# Patient Record
Sex: Female | Born: 1960 | Race: White | Hispanic: No | Marital: Married | Smoking: Never smoker
Health system: Southern US, Community
[De-identification: ages and names within clinical notes are randomized; demographics above are authoritative.]

## PROBLEM LIST (undated history)

## (undated) HISTORY — PX: BUNIONECTOMY: SHX129

## (undated) HISTORY — PX: KNEE SURGERY: SHX244

## (undated) HISTORY — PX: TONSILLECTOMY: SUR1361

---

## 2005-04-25 ENCOUNTER — Ambulatory Visit: Payer: Self-pay | Admitting: Family Medicine

## 2005-05-05 ENCOUNTER — Ambulatory Visit: Payer: Self-pay | Admitting: Family Medicine

## 2006-02-28 ENCOUNTER — Ambulatory Visit: Payer: Self-pay | Admitting: Family Medicine

## 2006-03-01 ENCOUNTER — Ambulatory Visit: Payer: Self-pay | Admitting: Family Medicine

## 2006-06-24 ENCOUNTER — Ambulatory Visit: Payer: Self-pay | Admitting: Family Medicine

## 2007-07-11 ENCOUNTER — Ambulatory Visit: Payer: Self-pay | Admitting: Family Medicine

## 2007-07-14 ENCOUNTER — Ambulatory Visit: Payer: Self-pay | Admitting: Family Medicine

## 2010-02-20 ENCOUNTER — Ambulatory Visit: Payer: Self-pay | Admitting: *Deleted

## 2011-05-26 ENCOUNTER — Ambulatory Visit: Payer: Self-pay | Admitting: *Deleted

## 2011-06-03 DIAGNOSIS — M9420 Chondromalacia, unspecified site: Secondary | ICD-10-CM | POA: Insufficient documentation

## 2011-09-24 DIAGNOSIS — M21619 Bunion of unspecified foot: Secondary | ICD-10-CM | POA: Insufficient documentation

## 2012-03-24 ENCOUNTER — Ambulatory Visit: Payer: Self-pay | Admitting: Otolaryngology

## 2012-10-11 IMAGING — US ULTRASOUND LEFT BREAST
1 series · 14 of 14 positions shown · non-contrast
Comparison: none

REASON FOR EXAM: LT BRST MASS 12 OCLOCK 3 OCLOCK
COMMENTS:

[Series 1: ultrasound left breast · 14 of 14 slices shown]
[im 1/14]
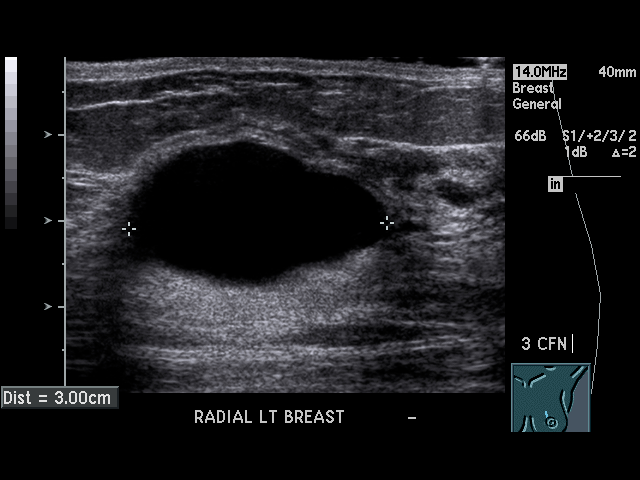
[im 2/14]
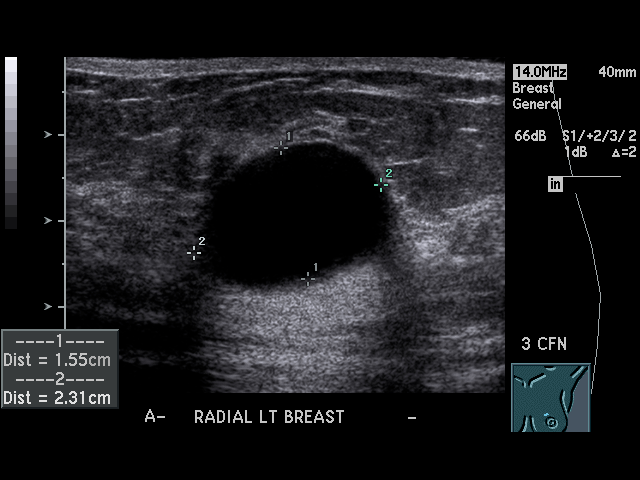
[im 3/14]
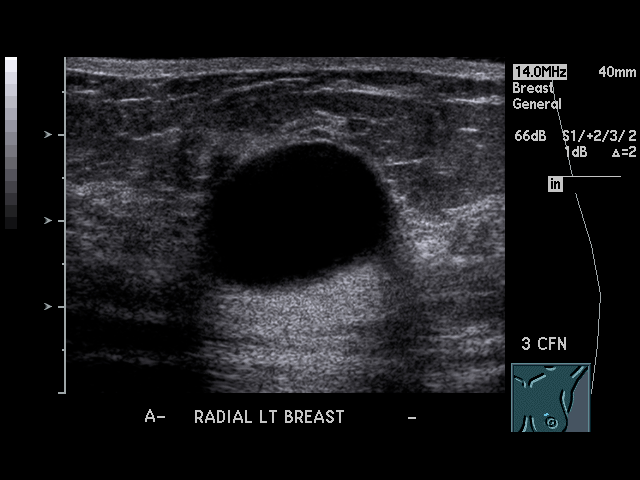
[im 4/14]
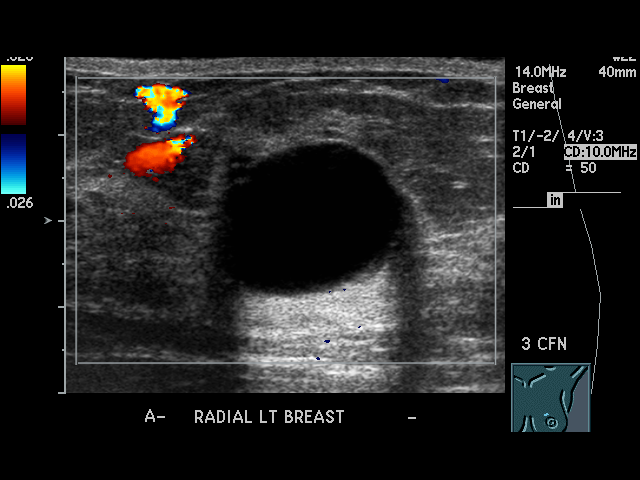
[im 5/14]
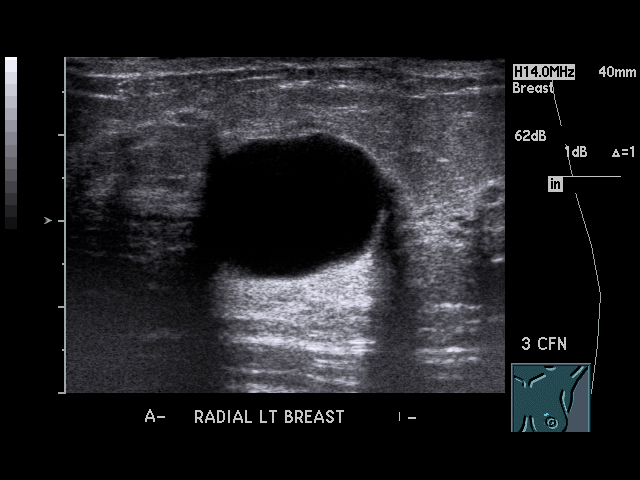
[im 6/14]
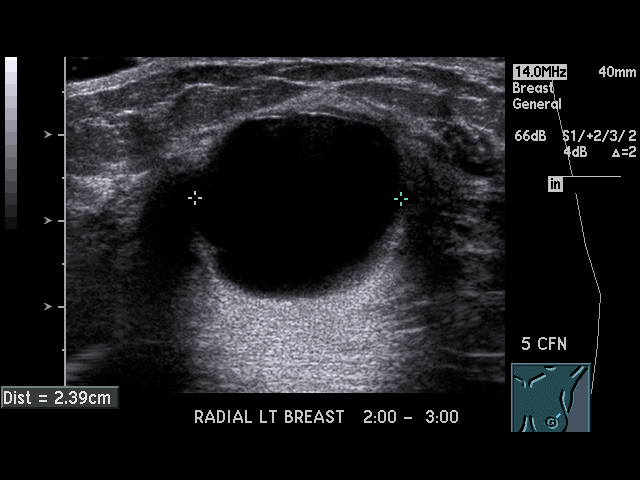
[im 7/14]
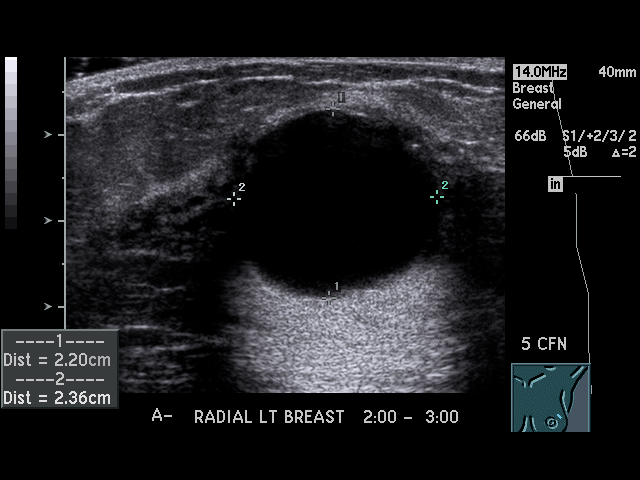
[im 8/14]
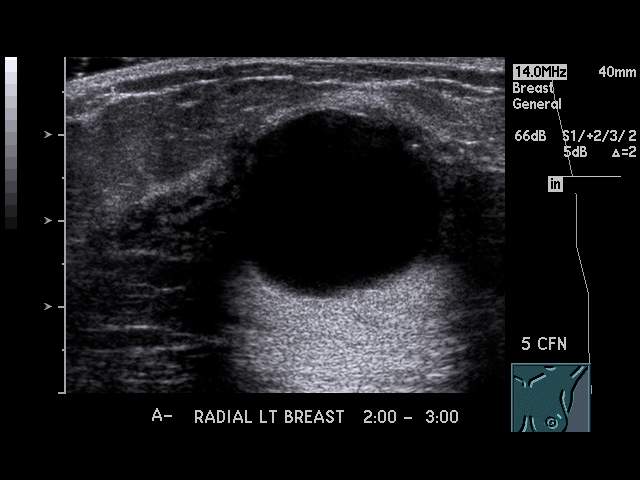
[im 9/14]
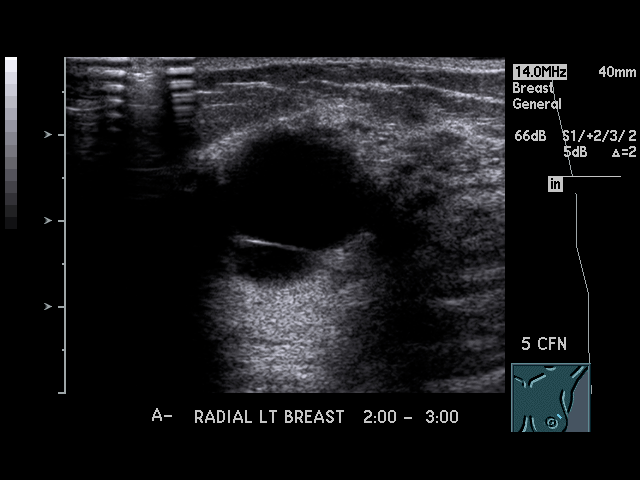
[im 10/14]
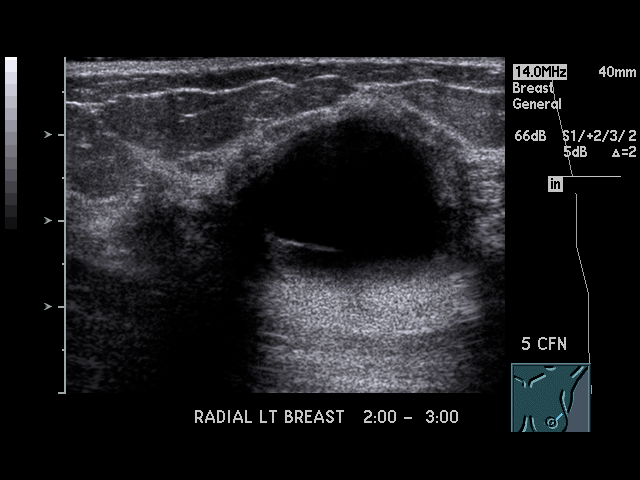
[im 11/14]
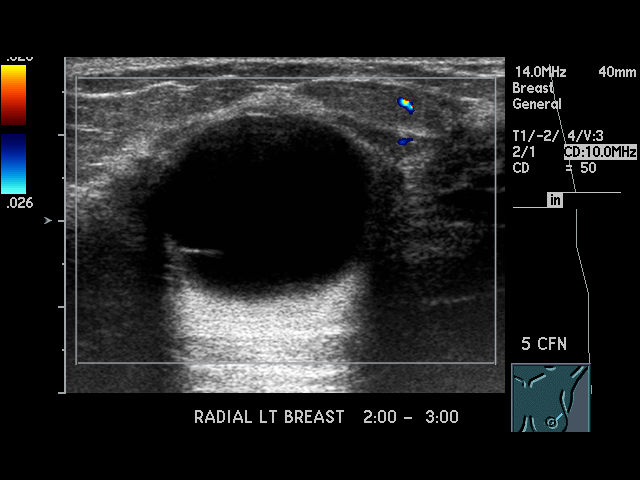
[im 12/14]
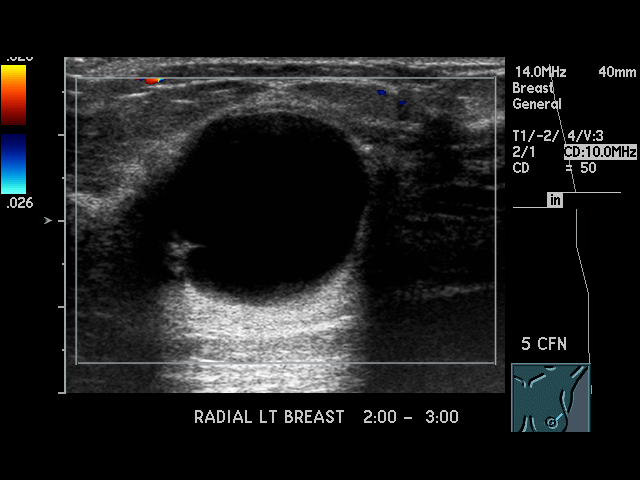
[im 13/14]
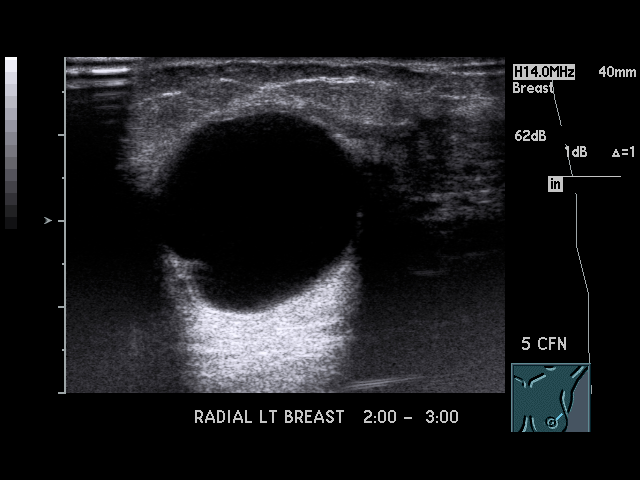
[im 14/14]
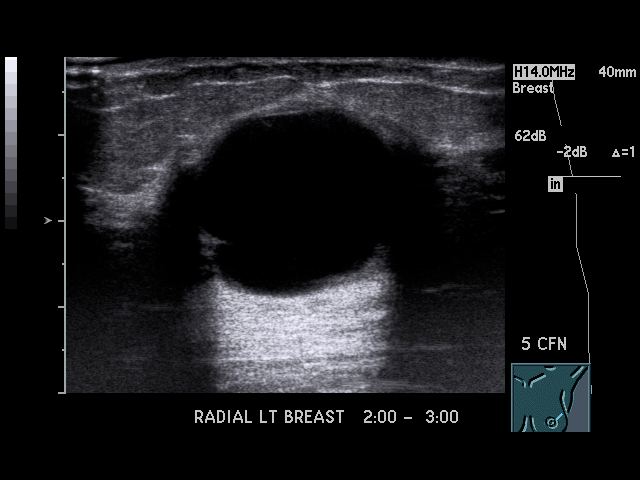

[14 of 14 positions shown; findings below may reference images not displayed]

PROCEDURE:     US  - US BREAST LEFT  - May 26, 2011 [DATE]

RESULT:

The patient has two, palpable lumps in the left breast. The report for the
targeted ultrasound examination was incorporated into the mammogram report
of this same day. In summary, there is a 3 cm cyst between 11 o'clock and 12
o'clock and a 2.4 cm, septated cyst at 2 o'clock to 3 o'clock. These are
sufficient to explain the palpable areas of concern and are benign findings
with no additional work-up needed. No findings sonographically considered
suspicious for malignancy are identified.
IMPRESSION: Benign appearing Targeted Left Breast Ultrasound. As noted
above, there are two, benign appearing cysts in the left breast sufficient
to explain the palpable areas of concern.

## 2014-02-23 ENCOUNTER — Ambulatory Visit: Payer: Self-pay | Admitting: Otolaryngology

## 2014-03-23 ENCOUNTER — Ambulatory Visit: Payer: Self-pay | Admitting: Otolaryngology

## 2015-01-06 ENCOUNTER — Emergency Department
Admission: EM | Admit: 2015-01-06 | Discharge: 2015-01-06 | Disposition: A | Discharge status: Home / Self Care | Payer: Self-pay

## 2015-05-23 DIAGNOSIS — G43109 Migraine with aura, not intractable, without status migrainosus: Secondary | ICD-10-CM | POA: Insufficient documentation

## 2015-05-23 DIAGNOSIS — K219 Gastro-esophageal reflux disease without esophagitis: Secondary | ICD-10-CM | POA: Insufficient documentation

## 2015-05-23 DIAGNOSIS — F411 Generalized anxiety disorder: Secondary | ICD-10-CM | POA: Insufficient documentation

## 2015-12-05 DIAGNOSIS — Z79899 Other long term (current) drug therapy: Secondary | ICD-10-CM | POA: Insufficient documentation

## 2016-03-03 DIAGNOSIS — M170 Bilateral primary osteoarthritis of knee: Secondary | ICD-10-CM | POA: Insufficient documentation

## 2017-06-21 ENCOUNTER — Ambulatory Visit
Admission: EM | Admit: 2017-06-21 | Discharge: 2017-06-21 | Disposition: A | Discharge status: Home / Self Care | Payer: 59 | Attending: Family Medicine | Admitting: Family Medicine

## 2017-06-21 ENCOUNTER — Other Ambulatory Visit: Payer: Self-pay

## 2017-06-21 DIAGNOSIS — H6593 Unspecified nonsuppurative otitis media, bilateral: Secondary | ICD-10-CM | POA: Diagnosis not present

## 2017-06-21 DIAGNOSIS — J029 Acute pharyngitis, unspecified: Secondary | ICD-10-CM

## 2017-06-21 NOTE — Discharge Instructions (Signed)
Take medication as discussed. Rest. Drink plenty of fluids.  ° °Follow up with your primary care physician this week as needed. Return to Urgent care for new or worsening concerns.  ° °

## 2017-06-21 NOTE — ED Provider Notes (Signed)
[facility] ____________________________________________  Time seen: Approximately 1030 AM  I have reviewed the triage vital signs and the nursing notes.   HISTORY  Chief Complaint Ear Fullness   HPI Jillian Lewis is a 57 y.o. female presenting for evaluation of 3-4 days of bilateral ear congested feeling, intermittent crackling and fullness sensation.  Denies ear pain but states fullness is present right greater than left.  Also reports some sore throat, states sore throat is currently moderate.  Denies nasal congestion, and states occasional cough.  States occasional seasonal allergies.  Denies accompanying fevers.  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  Has not taken any over-the-counter medications for the same complaints.  Continues to eat and drink well.  Denies other aggravating or alleviating factors. Denies chest pain, shortness of breath, abdominal pain, or rash. Denies recent sickness. Denies recent antibiotic use.    History reviewed. No pertinent past medical history.  There are no active problems to display for this patient.   Past Surgical History:  Procedure Laterality Date  . BUNIONECTOMY    . KNEE SURGERY Left   . TONSILLECTOMY       No current facility-administered medications for this encounter.   Current Outpatient Medications:  .  buPROPion (WELLBUTRIN XL) 150 MG 24 hr tablet, Take 150 mg by mouth daily., Disp: , Rfl:  .  citalopram (CELEXA) 20 MG tablet, Take 20 mg by mouth daily., Disp: , Rfl:   Allergies Patient has no known allergies.  History reviewed. No pertinent family history.  Social History Social History   Tobacco Use  . Smoking status: Never Smoker  . Smokeless tobacco: Never Used  Substance Use Topics  . Alcohol use: Yes    Comment: occasionally  . Drug use: No    Review of Systems Constitutional: No fever/chills Eyes: No visual changes. ENT: As above Cardiovascular: Denies  chest pain. Respiratory: Denies shortness of breath. Gastrointestinal: No abdominal pain. Musculoskeletal: Negative for back pain. Skin: Negative for rash.   ____________________________________________   PHYSICAL EXAM:  VITAL SIGNS: ED Triage Vitals  Enc Vitals Group     BP 06/21/17 0941 108/67     Pulse Rate 06/21/17 0941 80     Resp 06/21/17 0941 18     Temp 06/21/17 0941 98.1 F (36.7 C)     Temp Source 06/21/17 0941 Oral     SpO2 06/21/17 0941 99 %     Weight 06/21/17 0938 148 lb (67.1 kg)     Height 06/21/17 0938 5\' 2"  (1.575 m)     Head Circumference --      Peak Flow --      Pain Score 06/21/17 0938 0     Pain Loc --      Pain Edu? --      Excl. in [place]? --     Constitutional: Alert and oriented. Well appearing and in no acute distress. Eyes: Conjunctivae are normal.  Head: Atraumatic. No sinus tenderness to palpation. No swelling. No erythema.  Ears: Nontender, normal canal, mild effusion bilaterally and normal TMs bilaterally.  No mastoid tenderness bilaterally.  Nose:No nasal congestion  Mouth/Throat: Mucous membranes are moist. Mild pharyngeal erythema. No tonsillar swelling or exudate.  Neck: No stridor.  No cervical spine tenderness to palpation. Hematological/Lymphatic/Immunilogical: No cervical lymphadenopathy. Cardiovascular: Normal rate, regular rhythm. Grossly normal heart sounds.  Good peripheral circulation. Respiratory: Normal respiratory effort.  No retractions. No wheezes, rales or rhonchi. Good air movement.  Gastrointestinal: Soft and nontender.  Musculoskeletal: Ambulatory with steady gait. No cervical, thoracic or lumbar tenderness to palpation. Neurologic:  Normal speech and language. No gait instability. Skin:  Skin appears warm, dry and intact. No rash noted. Psychiatric: Mood and affect are normal. Speech and behavior are normal. ___________________________________________   LABS (all labs ordered are listed, but only abnormal results  are displayed)  Labs Reviewed - No data to display   PROCEDURES Procedures    ___________________________________________   INITIAL IMPRESSION / ASSESSMENT AND PLAN / ED COURSE  Pertinent labs & imaging results that were available during my care of the patient were reviewed by me and considered in my medical decision making (see chart for details).  Well-appearing patient.  No acute distress.  Mild bilateral ear effusion, no otitis media noted.  Also with pharyngitis complaint.  Discussed evaluation of strep swab to exclude strep, patient declined, and states that she will follow-up if sore throat continues.  Encourage over-the-counter Claritin or Zyrtec, discussed suspect viral or allergic cause.  Encourage rest, fluids, supportive care.  Discussed follow up with Primary care physician this week. Discussed follow up and return parameters including no resolution or any worsening concerns. Patient verbalized understanding and agreed to plan.   ____________________________________________   FINAL CLINICAL IMPRESSION(S) / ED DIAGNOSES  Final diagnoses:  Fluid level behind tympanic membrane of both ears  Pharyngitis, unspecified etiology     ED Discharge Orders    None       Note: This dictation was prepared with Dragon dictation along with smaller phrase technology. Any transcriptional errors that result from this process are unintentional.         Renford Dills, NP 06/21/17 1124

## 2017-06-21 NOTE — ED Triage Notes (Signed)
Patient complains of bilateral ear fullness. Patient states that this started 3-4 days ago.

## 2019-03-07 DIAGNOSIS — I10 Essential (primary) hypertension: Secondary | ICD-10-CM | POA: Insufficient documentation

## 2020-12-11 DIAGNOSIS — G459 Transient cerebral ischemic attack, unspecified: Secondary | ICD-10-CM | POA: Insufficient documentation

## 2020-12-13 DIAGNOSIS — U071 COVID-19: Secondary | ICD-10-CM | POA: Insufficient documentation

## 2021-06-25 DIAGNOSIS — E042 Nontoxic multinodular goiter: Secondary | ICD-10-CM | POA: Insufficient documentation

## 2021-12-29 ENCOUNTER — Ambulatory Visit
Admission: EM | Admit: 2021-12-29 | Discharge: 2021-12-29 | Disposition: A | Discharge status: Home / Self Care | Payer: BC Managed Care – PPO

## 2021-12-29 ENCOUNTER — Ambulatory Visit (INDEPENDENT_AMBULATORY_CARE_PROVIDER_SITE_OTHER): Payer: BC Managed Care – PPO

## 2021-12-29 DIAGNOSIS — R059 Cough, unspecified: Secondary | ICD-10-CM | POA: Diagnosis not present

## 2021-12-29 DIAGNOSIS — J3089 Other allergic rhinitis: Secondary | ICD-10-CM | POA: Diagnosis not present

## 2021-12-29 DIAGNOSIS — J040 Acute laryngitis: Secondary | ICD-10-CM | POA: Diagnosis not present

## 2021-12-29 DIAGNOSIS — R0789 Other chest pain: Secondary | ICD-10-CM | POA: Diagnosis not present

## 2021-12-29 NOTE — ED Notes (Signed)
Patient is being discharged from the Urgent Care and sent to the Emergency Department via personal vehicles . Per Sunday Spillers, [place], patient is in need of higher level of care due to further work-up. Patient is aware and verbalizes understanding of plan of care.  Vitals:   12/29/21 1203  BP: 131/84  Pulse: 84  Resp: 16  Temp: 99 F (37.2 C)  SpO2: 96%

## 2021-12-29 NOTE — ED Provider Notes (Signed)
[facility]    CSN: [id number] Arrival date & time: 12/29/21  1152      History   Chief Complaint Chief Complaint  Patient presents with   Cough   Nasal Congestion    HPI Jillian Lewis is a 61 y.o. female who presents with nose and chest congestion, horsed x 3 weeks. Has been having non productive cough x 2 weeks.Has hx of allergies, so has been taking Claritin. Yesterday at the end of her  walk she noticed substernal chest pressure which has been intermittent. Denies more sweating than her normal. Denies fever.     History reviewed. No pertinent past medical history.  Patient Active Problem List   Diagnosis Date Noted   Multiple thyroid nodules 06/25/2021   COVID-19 virus infection 12/13/2020   TIA (transient ischemic attack) 12/11/2020   Benign essential hypertension 03/07/2019   Primary osteoarthritis of both knees 03/03/2016   Encounter for long-term current use of medication 12/05/2015   GAD (generalized anxiety disorder) 05/23/2015   Gastroesophageal reflux disease without esophagitis 05/23/2015   Migraine with aura and without status migrainosus, not intractable 05/23/2015   Bunion 09/24/2011   Chondromalacia 06/03/2011    Past Surgical History:  Procedure Laterality Date   BUNIONECTOMY     KNEE SURGERY Left    TONSILLECTOMY      OB History   No obstetric history on file.      Home Medications    Prior to Admission medications   Medication Sig Start Date End Date Taking? Authorizing Provider  verapamil (CALAN-SR) 240 MG CR tablet Take 1 tablet by mouth daily. 08/04/21  Yes [provider]  atorvastatin (LIPITOR) 40 MG tablet Take 40 mg by mouth daily. 12/26/21   [provider]  buPROPion (WELLBUTRIN XL) 150 MG 24 hr tablet Take 150 mg by mouth daily.    [provider]  citalopram (CELEXA) 20 MG tablet Take 20 mg by mouth daily.    [provider]  zolpidem (AMBIEN) 5 MG tablet Take 5 mg by mouth at  bedtime as needed. 08/03/21   [provider]    Family History History reviewed. No pertinent family history.  Social History Social History   Tobacco Use   Smoking status: Never   Smokeless tobacco: Never  Vaping Use   Vaping Use: Never used  Substance Use Topics   Alcohol use: Yes    Comment: occasionally   Drug use: No     Allergies   Patient has no known allergies.   Review of Systems Review of Systems  Constitutional:  Negative for appetite change, chills and fever.  HENT:  Positive for congestion, ear pain, postnasal drip, rhinorrhea and voice change. Negative for ear discharge.   Eyes:  Negative for discharge.  Respiratory:  Positive for cough and chest tightness. Negative for shortness of breath.   Cardiovascular:  Positive for chest pain.  Hematological:  Negative for adenopathy.     Physical Exam Triage Vital Signs ED Triage Vitals  Enc Vitals Group     BP 12/29/21 1203 131/84     Pulse Rate 12/29/21 1203 84     Resp 12/29/21 1203 16     Temp 12/29/21 1203 99 F (37.2 C)     Temp Source 12/29/21 1203 Oral     SpO2 12/29/21 1203 96 %     Weight --      Height --      Head Circumference --  Peak Flow --      Pain Score 12/29/21 1201 0     Pain Loc --      Pain Edu? --      Excl. in [place]? --    No data found.  Updated Vital Signs BP 131/84 (BP Location: Left Arm)   Pulse 84   Temp 99 F (37.2 C) (Oral)   Resp 16   SpO2 96%   Visual Acuity Right Eye Distance:   Left Eye Distance:   Bilateral Distance:    Right Eye Near:   Left Eye Near:    Bilateral Near:      Physical Exam Vitals signs and nursing note reviewed.  Constitutional:      General: She is not in acute distress.    Appearance: Normal appearance. She is not ill-appearing, toxic-appearing or diaphoretic. Her voice is a little horsed HENT:     Head: Normocephalic.     Right Ear: Tympanic membrane, ear canal and external ear normal.     Left Ear: Tympanic  membrane, ear canal and external ear normal.     Nose: Nose normal.     Mouth/Throat: clear    Mouth: Mucous membranes are moist.  Eyes:     General: No scleral icterus.       Right eye: No discharge.        Left eye: No discharge.     Conjunctiva/sclera: Conjunctivae normal.  Neck:     Musculoskeletal: Neck supple. No neck rigidity.  Cardiovascular:     Rate and Rhythm: Normal rate and regular rhythm.     Heart sounds: No murmur.  Pulmonary:     Effort: Pulmonary effort is normal.     Breath sounds: Normal breath sounds. Chest wall is non tender.  AMusculoskeletal: Normal range of motion. Lymphadenopathy:     Cervical: No cervical adenopathy.  Skin:    General: Skin is warm and dry.     Coloration: Skin is not jaundiced.     Findings: No rash.  Neurological:     Mental Status: She is alert and oriented to person, place, and time.     Gait: Gait normal.  Psychiatric:        Mood and Affect: Mood normal.        Behavior: Behavior normal.        Thought Content: Thought content normal.        Judgment: Judgment normal.    UC Treatments / Results  Labs (all labs ordered are listed, but only abnormal results are displayed) Labs Reviewed - No data to display  EKG NSR with ST  abnormality. There are no old ones to compare this with.   Radiology DG Chest 2 View  Result Date: 12/29/2021 CLINICAL DATA:  chest pressure, cough x 2 weeks EXAM: CHEST - 2 VIEW COMPARISON:  None Available. FINDINGS: The heart size and mediastinal contours are within normal limits. Both lungs are clear. No visible pleural effusions or pneumothorax. No acute osseous abnormality. Multilevel degenerative change in the spine. IMPRESSION: No evidence of acute cardiopulmonary disease. Electronically Signed   By: Margaretha Sheffield M.D.   On: 12/29/2021 12:56    Procedures Procedures (including critical care time)  Medications Ordered in UC Medications - No data to display  Initial Impression /  Assessment and Plan / UC Course  I have reviewed the triage vital signs and the nursing notes.  Pertinent imaging results that were available during my care of the patient were reviewed  by me and considered in my medical decision making (see chart for details).   Allergic rhinitis Cough Laryngitis Chest pressure Abnormal EKG  Advised to go to ER for further work up of her chest pressure. She may switch allergy med     Final Clinical Impressions(s) / UC Diagnoses  Chest pressure with abnormal EKG Cough x 3 weeks with neg chest xray since pulse ox was low.  Allergies Laryngitis  Final diagnoses:  Laryngitis  Seasonal allergic rhinitis due to other allergic trigger  Chest pressure     Discharge Instructions      Your chest xray is normal I am unable to compare your current EKG with the [facility] one since I cant pull the image from them. It is best you go to [facility] ER to have more labs done which we cant do here.      ED Prescriptions   None    PDMP not reviewed this encounter.   Shelby Mattocks, [place] 12/29/21 1541

## 2021-12-29 NOTE — Discharge Instructions (Signed)
Your chest xray is normal I am unable to compare your current EKG with the [facility] one since I cant pull the image from them. It is best you go to [facility] ER to have more labs done which we cant do here.

## 2021-12-29 NOTE — ED Triage Notes (Signed)
Patient presents nasal congestion, chst congestion, cough, and hoarseness x 3 weeks. Hx of allergies. Treating symptoms with her daily Claritin.  Denies fever.
